# Patient Record
Sex: Male | Born: 1995 | Race: Black or African American | Hispanic: No | Marital: Single | Smoking: Never smoker
Health system: Southern US, Community
[De-identification: ages and names within clinical notes are randomized; demographics above are authoritative.]

---
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
## 2016-09-08 DIAGNOSIS — D509 Iron deficiency anemia, unspecified: Secondary | ICD-10-CM | POA: Diagnosis not present

## 2016-09-08 DIAGNOSIS — Z0001 Encounter for general adult medical examination with abnormal findings: Secondary | ICD-10-CM | POA: Diagnosis not present

## 2016-09-08 DIAGNOSIS — E538 Deficiency of other specified B group vitamins: Secondary | ICD-10-CM | POA: Diagnosis not present

## 2016-09-08 DIAGNOSIS — M2550 Pain in unspecified joint: Secondary | ICD-10-CM | POA: Diagnosis not present

## 2016-09-08 DIAGNOSIS — M25512 Pain in left shoulder: Secondary | ICD-10-CM | POA: Diagnosis not present

## 2016-09-08 DIAGNOSIS — R5383 Other fatigue: Secondary | ICD-10-CM | POA: Diagnosis not present

## 2016-09-22 DIAGNOSIS — E7412 Hereditary fructose intolerance: Secondary | ICD-10-CM | POA: Diagnosis not present

## 2016-09-22 DIAGNOSIS — Z0283 Encounter for blood-alcohol and blood-drug test: Secondary | ICD-10-CM | POA: Diagnosis not present

## 2016-12-07 DIAGNOSIS — R69 Illness, unspecified: Secondary | ICD-10-CM | POA: Diagnosis not present

## 2016-12-08 DIAGNOSIS — R69 Illness, unspecified: Secondary | ICD-10-CM | POA: Diagnosis not present

## 2017-02-01 DIAGNOSIS — R5382 Chronic fatigue, unspecified: Secondary | ICD-10-CM | POA: Diagnosis not present

## 2017-06-16 ENCOUNTER — Encounter (HOSPITAL_COMMUNITY): Payer: Self-pay | Admitting: *Deleted

## 2017-06-16 ENCOUNTER — Other Ambulatory Visit: Payer: Self-pay

## 2017-06-16 ENCOUNTER — Inpatient Hospital Stay (HOSPITAL_COMMUNITY)
Admission: RE | Admit: 2017-06-16 | Discharge: 2017-06-19 | DRG: 885 | Disposition: A | Discharge status: Home / Self Care | Payer: BLUE CROSS/BLUE SHIELD | Attending: Psychiatry | Admitting: Psychiatry

## 2017-06-16 DIAGNOSIS — Z818 Family history of other mental and behavioral disorders: Secondary | ICD-10-CM

## 2017-06-16 DIAGNOSIS — F1290 Cannabis use, unspecified, uncomplicated: Secondary | ICD-10-CM | POA: Diagnosis present

## 2017-06-16 DIAGNOSIS — F332 Major depressive disorder, recurrent severe without psychotic features: Secondary | ICD-10-CM | POA: Diagnosis not present

## 2017-06-16 DIAGNOSIS — Z560 Unemployment, unspecified: Secondary | ICD-10-CM

## 2017-06-16 DIAGNOSIS — Z630 Problems in relationship with spouse or partner: Secondary | ICD-10-CM | POA: Diagnosis not present

## 2017-06-16 MED ORDER — HYDROXYZINE HCL 25 MG PO TABS
25.0000 mg | ORAL_TABLET | Freq: Three times a day (TID) | ORAL | Status: DC | PRN
  Filled 2017-06-16: qty 1

## 2017-06-16 MED ORDER — MAGNESIUM HYDROXIDE 400 MG/5ML PO SUSP: 30.0000 mL | Freq: Every day | ORAL | Status: DC | PRN

## 2017-06-16 MED ORDER — DULOXETINE HCL 30 MG PO CPEP
30.0000 mg | ORAL_CAPSULE | Freq: Every day | ORAL | Status: DC
  Administered 2017-06-16: 30 mg via ORAL
  Filled 2017-06-16 (×3): qty 1

## 2017-06-16 MED ORDER — ALUM & MAG HYDROXIDE-SIMETH 200-200-20 MG/5ML PO SUSP: 30.0000 mL | ORAL | Status: DC | PRN

## 2017-06-16 MED ORDER — TRAZODONE HCL 50 MG PO TABS
50.0000 mg | ORAL_TABLET | Freq: Every evening | ORAL | Status: DC | PRN
  Administered 2017-06-16: 50 mg via ORAL
  Filled 2017-06-16: qty 1

## 2017-06-16 MED ORDER — ACETAMINOPHEN 325 MG PO TABS: 650.0000 mg | ORAL_TABLET | Freq: Four times a day (QID) | ORAL | Status: DC | PRN

## 2017-06-16 NOTE — Tx Team (Signed)
Initial Treatment Plan 06/16/2017 9:14 PM Jermaine DoomAndrew Daigle ZOX:[id number]    PATIENT STRESSORS: Financial difficulties Other: Social anxiety   PATIENT STRENGTHS: Ability for insight Active sense of humor Average or above average intelligence Capable of independent living General fund of knowledge Motivation for treatment/growth Supportive family/friends   PATIENT IDENTIFIED PROBLEMS: Depression Anxiety Suicidal thoughts "Figuring out how to balance it" "Biological father had depression"                     DISCHARGE CRITERIA:  Ability to meet basic life and health needs Improved stabilization in mood, thinking, and/or behavior Reduction of life-threatening or endangering symptoms to within safe limits Verbal commitment to aftercare and medication compliance  PRELIMINARY DISCHARGE PLAN: Attend aftercare/continuing care group  PATIENT/FAMILY INVOLVEMENT: This treatment plan has been presented to and reviewed with the patient, Jermaine Mayer, and/or family member, .  The patient and family have been given the opportunity to ask questions and make suggestions.  Bryelle Spiewak, [place], [place]RN 06/16/2017, 9:14 PM

## 2017-06-16 NOTE — H&P (Signed)
Behavioral Health Medical Screening Exam  Jermaine Mayer is an 21 y.o. male.  Total Time spent with patient: 20 minutes  Psychiatric Specialty Exam: Physical Exam  Constitutional: He is oriented to person, place, and time. He appears well-developed and well-nourished. No distress.  HENT:  Head: Normocephalic and atraumatic.  Right Ear: External ear normal.  Left Ear: External ear normal.  Eyes: Conjunctivae are normal. Right eye exhibits no discharge. Left eye exhibits no discharge. No scleral icterus.  Cardiovascular: Normal rate, regular rhythm and normal heart sounds.  Respiratory: Effort normal and breath sounds normal. No respiratory distress.  Musculoskeletal: Normal range of motion.  Neurological: He is alert and oriented to person, place, and time.  Skin: Skin is warm and dry. He is not diaphoretic.  Psychiatric: His speech is normal. His mood appears anxious. He is withdrawn. He is not actively hallucinating. Thought content is not paranoid and not delusional. He expresses impulsivity and inappropriate judgment. He exhibits a depressed mood. He expresses suicidal ideation. He expresses no homicidal ideation. He expresses suicidal plans.    Review of Systems  Psychiatric/Behavioral: Positive for depression and suicidal ideas. Negative for hallucinations, memory loss and substance abuse. The patient is nervous/anxious and has insomnia.   All other systems reviewed and are negative.   Blood pressure 131/85, pulse 84, temperature 98.9 F (37.2 C), resp. rate 16, SpO2 99 %.There is no height or weight on file to calculate BMI.  General Appearance: Casual and Fairly Groomed  Eye Contact:  Fair  Speech:  Clear and Coherent and Normal Rate  Volume:  Normal  Mood:  Anxious, Depressed, Dysphoric, Hopeless and Worthless  Affect:  Congruent and Depressed  Thought Process:  Coherent and Descriptions of Associations: Intact  Orientation:  Full (Time, Place, and Person)  Thought Content:   Logical and Hallucinations: None  Suicidal Thoughts:  Yes.  without intent/plan  Homicidal Thoughts:  No  Memory:  Immediate;   Fair Recent;   Fair  Judgement:  Fair  Insight:  Fair  Psychomotor Activity:  Decreased  Concentration: Concentration: Fair and Attention Span: Fair  Recall:  [employer]Fair  Fund of Knowledge:Good  Language: Good  Akathisia:  No  Handed:  Right  AIMS (if indicated):     Assets:  Communication Skills Desire for Improvement Housing Intimacy Leisure Time Physical Health  Sleep:       Musculoskeletal: Strength & Muscle Tone: within normal limits Gait & Station: normal  Blood pressure 131/85, pulse 84, temperature 98.9 F (37.2 C), resp. rate 16, SpO2 99 %.  Recommendations:  Based on my evaluation the patient does not appear to have an emergency medical condition.  Jackelyn PolingJason A Zaxton Angerer, NP 06/16/2017, 8:59 PM

## 2017-06-16 NOTE — BH Assessment (Signed)
Assessment Note  Jermaine Mayer is an 21 y.o. male [facility] by EMS with mother  [facility] Pt reports he has hx of depression. Pt reports he was in the shower with clothes on crying from 10:00 AM to later in afternoon when mother came in the door and found him. Pt's mother reports she has not been able to get him to the phone or door for over a week and came to day to check on him. Pt's mother reports she had "banged on the door for hours and finally called 911 and broke in the door and found him in the shower with his clothes on. I thought he the worst. I thought he was dead". Pt denies homicidal thoughts or physical aggression. Pt denies having access to firearms. Pt denies having any legal problems at this time. Pt denies hallucinations. Pt does not appear to be responding to internal stimuli and exhibits no delusional thought. Pt's reality testing appears to be intact. Pt reports he smoked cannabis about 4 weeks ago, drank alcohol a few weeks ago, had amphetamines over 18 months ago.   Pt could not reports any stressors other than, "I feel like everyone is moving but me". Pt completed 2 years of college but did not finish. Pt reports he does not have a job. Pt reports he has battled depression since he was an adolescent. He feels the depression has become worse over the past few months. Pt reports hx of cutting when he was an adolescent. Pt reports his mother and family all support him.   Pt reports he [facility] a few months ago in an outpatient setting. Pt denies hx of inpatient hospitalization.   Pt is unremarkably dressed, alert, oriented x4 with normal speech and normal motor behavior. Eye contact is good and Pt is tearful. Pt's mood is depressed and affect is hopeless. Thought process is coherent and relevant. Pt's insight is poor and judgement is poor. There is no indication Pt is currently responding to internal stimuli or experiencing delusional thought content. Pt was cooperative  throughout assessment. He says he is willing to sign voluntarily into a psychiatric facility.    Diagnosis: F32.2 Major depressive disorder, Single episode, Severe   Past Medical History: No past medical history on file.  Family History: No family history on file.  Social History:  has no tobacco, alcohol, and drug history on file.  Additional Social History:  Alcohol / Drug Use Pain Medications: See MAR Prescriptions: See MAR Over the Counter: See MAR History of alcohol / drug use?: Yes Substance #1 Name of Substance 1: Cannabis 1 - Age of First Use: 16 1 - Amount (size/oz): Blunt 1 - Frequency: Every now and then 1 - Last Use / Amount: 6 weeks ago Substance #2 Name of Substance 2: ETOH 2 - Age of First Use: 20 2 - Amount (size/oz): varies 2 - Frequency: every now and then 2 - Last Use / Amount: 3 months ago Substance #3 Name of Substance 3: Amphetamines  3 - Age of First Use: 17 3 - Amount (size/oz): Ukn 3 - Frequency: every now and then 3 - Last Use / Amount: 18 months ago  CIWA:   COWS:    Allergies: Allergies not on file  Home Medications:  No medications prior to admission.    OB/GYN Status:  No LMP for male patient.  General Assessment Data [facility] TTS Assessment: In system Is this a Tele or  Face-to-Face Assessment?: Face-to-Face Is this an Initial Assessment or a Re-assessment for this encounter?: Initial Assessment Marital status: Single Is patient pregnant?: No Pregnancy Status: No Living Arrangements: Alone Can pt return to current living arrangement?: Yes Admission Status: Voluntary Is patient capable of signing voluntary admission?: Yes Referral Source: Self/Family/Friend Insurance type: BCBS  Medical Screening Exam [facility] Walk-in ONLY) Medical Exam completed: Yes  Crisis Care Plan Living Arrangements: Alone Name of Psychiatrist: [facility] Name of Therapist: [facility]  Education Status Is  patient currently in school?: No Highest grade of school patient has completed: Some college  Risk to self with the past 6 months Suicidal Ideation: Yes-Currently Present Has patient been a risk to self within the past 6 months prior to admission? : No Suicidal Intent: Yes-Currently Present Has patient had any suicidal intent within the past 6 months prior to admission? : No Is patient at risk for suicide?: Yes Suicidal Plan?: Yes-Currently Present Has patient had any suicidal plan within the past 6 months prior to admission? : No Specify Current Suicidal Plan: Wreck car Access to Means: Yes Specify Access to Suicidal Means: Pt has a car What has been your use of drugs/alcohol within the last 12 months?: Cannabis Previous Attempts/Gestures: No Other Self Harm Risks: None Triggers for Past Attempts: None known Intentional Self Injurious Behavior: Cutting(Cutting when he was a teen) Family Suicide History: No Recent stressful life event(s): Job Loss, Other (Comment)(Pt feels he isn't moving forward like everyone else) Persecutory voices/beliefs?: No Depression: Yes Depression Symptoms: Insomnia, Tearfulness, Isolating, Fatigue, Loss of interest in usual pleasures, Feeling worthless/self pity, Despondent Substance abuse history and/or treatment for substance abuse?: Yes(Cannabis, Amphetamines, Alcohol) Suicide prevention information given to non-admitted patients: Not applicable  Risk to Others within the past 6 months Homicidal Ideation: No Does patient have any lifetime risk of violence toward others beyond the six months prior to admission? : No Thoughts of Harm to Others: No Current Homicidal Intent: No Current Homicidal Plan: No Access to Homicidal Means: No History of harm to others?: No Assessment of Violence: None Noted Does patient have access to weapons?: No Criminal Charges Pending?: No Does patient have a court date: No Is patient on probation?:  No  Psychosis Hallucinations: None noted Delusions: None noted  Mental Status Report Appearance/Hygiene: Unremarkable Eye Contact: Fair Motor Activity: Unremarkable Speech: Soft, Logical/coherent Level of Consciousness: Alert, Crying Mood: Depressed, Helpless, Sad, Worthless, low self-esteem Affect: Depressed, Sad Anxiety Level: Minimal Thought Processes: Coherent Judgement: Impaired Orientation: Person, Place, Time, Situation, Appropriate for developmental age Obsessive Compulsive Thoughts/Behaviors: None  Cognitive Functioning Concentration: Decreased Memory: Recent Intact IQ: Average Insight: Poor Impulse Control: Poor Appetite: Fair Weight Loss: 0 Weight Gain: 0 Sleep: Decreased Total Hours of Sleep: 4 Vegetative Symptoms: None  ADLScreening [facility] Assessment Services) Patient's cognitive ability adequate to safely complete daily activities?: Yes Patient able to express need for assistance with ADLs?: No Independently performs ADLs?: Yes (appropriate for developmental age)  Prior Inpatient Therapy Prior Inpatient Therapy: No  Prior Outpatient Therapy Prior Outpatient Therapy: Yes Prior Therapy Dates: 2018 Prior [facility] Facilty[facility]Provider[facility]): [facility] Reason for Treatment: Depression Does patient have an ACCT team?: No Does patient have Intensive In-House Services?  : No Does patient have [facility] services? : No Does patient have P4CC services?: No  ADL Screening (condition at time of admission) Patient's cognitive ability adequate to safely complete daily activities?: Yes Is the patient deaf or have difficulty hearing?: No Does the patient have difficulty seeing, even when wearing glasses/contacts?: No Does  the patient have difficulty concentrating, remembering, or making decisions?: No Patient able to express need for assistance with ADLs?: No Does the patient have difficulty dressing or bathing?: No Independently performs ADLs?: Yes (appropriate for  developmental age) Does the patient have difficulty walking or climbing stairs?: No Weakness of Legs: None Weakness of Arms/Hands: None       Abuse/Neglect Assessment (Assessment to be complete while patient is alone) Abuse/Neglect Assessment Can Be Completed: Yes Physical Abuse: Denies Verbal Abuse: Denies Sexual Abuse: Denies Exploitation of patient/patient's resources: Denies Self-Neglect: Denies Values / Beliefs Cultural Requests During Hospitalization: None Spiritual Requests During Hospitalization: None Consults Spiritual Care Consult Needed: No Social Work Consult Needed: No Advance Directives (For Healthcare) Does Patient Have a Medica[employer]chant navy officerl Advance Directive?: No Would patient like information on creating a medical advance directive?: No - Patient declined    Additional Information 1:1 In Past 12 Months?: No CIRT Risk: No Elopement Risk: No Does patient have medical clearance?: Yes     Disposition:  Disposition Initial Assessment Completed for this Encounter: Yes Disposition of Patient: Inpatient treatment program Type of inpatient treatment program: Adult   Per Nira ConnJason Berry, NP pt meets inpatient criteria. [facility]   On Site Evaluation by:   Reviewed with Physician:    Danae OrleansVanessa  Norvell Ureste, MA, LPCA 06/16/2017 8:30 PM

## 2017-06-16 NOTE — Progress Notes (Signed)
Jermaine Mayer is a 21 year old male pt admitted voluntarily as a walk-in after presenting with his mother. On admission, Jermaine Mayer appears sad and tearful and does endorse depression and passive thoughts of suicide but is able to contract for safety while in the hospital. He reports having social anxiety and spoke about how he was over-weight as a child and was teased and bullied because of it. He reports that he is prescribed cymbalta which he says he takes as prescribed. He denies any substance abuse issues and says he rarely drinks or uses any marijuana. He reports that he has been battling depression for a long time and reports this is his first hospitalization. He reports that he lives alone but is unsure where he will go once he is discharged. He was oriented to the unit and safety maintained.

## 2017-06-17 DIAGNOSIS — F1290 Cannabis use, unspecified, uncomplicated: Secondary | ICD-10-CM

## 2017-06-17 DIAGNOSIS — Z560 Unemployment, unspecified: Secondary | ICD-10-CM

## 2017-06-17 DIAGNOSIS — Z818 Family history of other mental and behavioral disorders: Secondary | ICD-10-CM

## 2017-06-17 DIAGNOSIS — Z630 Problems in relationship with spouse or partner: Secondary | ICD-10-CM

## 2017-06-17 DIAGNOSIS — F332 Major depressive disorder, recurrent severe without psychotic features: Principal | ICD-10-CM

## 2017-06-17 LAB — RAPID URINE DRUG SCREEN, HOSP PERFORMED
AMPHETAMINES: NOT DETECTED
BARBITURATES: NOT DETECTED
BENZODIAZEPINES: NOT DETECTED
Cocaine: NOT DETECTED
Opiates: NOT DETECTED
TETRAHYDROCANNABINOL: POSITIVE — AB

## 2017-06-17 LAB — COMPREHENSIVE METABOLIC PANEL
ALT: 20 U/L (ref 17–63)
ANION GAP: 9 (ref 5–15)
AST: 27 U/L (ref 15–41)
Albumin: 4.4 g/dL (ref 3.5–5.0)
Alkaline Phosphatase: 61 U/L (ref 38–126)
BUN: 11 mg/dL (ref 6–20)
CHLORIDE: 105 mmol/L (ref 101–111)
CO2: 25 mmol/L (ref 22–32)
Calcium: 9.7 mg/dL (ref 8.9–10.3)
Creatinine, Ser: 0.92 mg/dL (ref 0.61–1.24)
Glucose, Bld: 119 mg/dL — ABNORMAL HIGH (ref 65–99)
POTASSIUM: 3.5 mmol/L (ref 3.5–5.1)
SODIUM: 139 mmol/L (ref 135–145)
Total Bilirubin: 0.8 mg/dL (ref 0.3–1.2)
Total Protein: 8.1 g/dL (ref 6.5–8.1)

## 2017-06-17 LAB — LIPID PANEL
CHOL/HDL RATIO: 4.8 ratio
CHOLESTEROL: 177 mg/dL (ref 0–200)
HDL: 37 mg/dL — AB (ref >40)
LDL Cholesterol: 111 mg/dL — ABNORMAL HIGH (ref 0–99)
TRIGLYCERIDES: 144 mg/dL (ref <150)
VLDL: 29 mg/dL (ref 0–40)

## 2017-06-17 LAB — CBC
HCT: 46.4 % (ref 39.0–52.0)
Hemoglobin: 16.2 g/dL (ref 13.0–17.0)
MCH: 29.1 pg (ref 26.0–34.0)
MCHC: 34.9 g/dL (ref 30.0–36.0)
MCV: 83.3 fL (ref 78.0–100.0)
PLATELETS: 267 10*3/uL (ref 150–400)
RBC: 5.57 MIL/uL (ref 4.22–5.81)
RDW: 12.6 % (ref 11.5–15.5)
WBC: 10.8 10*3/uL — ABNORMAL HIGH (ref 4.0–10.5)

## 2017-06-17 LAB — HEMOGLOBIN A1C
HEMOGLOBIN A1C: 4.8 % (ref 4.8–5.6)
MEAN PLASMA GLUCOSE: 91.06 mg/dL

## 2017-06-17 LAB — TSH: TSH: 1.367 u[IU]/mL (ref 0.350–4.500)

## 2017-06-17 MED ORDER — DULOXETINE HCL 60 MG PO CPEP
60.0000 mg | ORAL_CAPSULE | Freq: Every day | ORAL | Status: DC
  Administered 2017-06-17 – 2017-06-19 (×3): 60 mg via ORAL
  Filled 2017-06-17 (×5): qty 1

## 2017-06-17 NOTE — Progress Notes (Signed)
Nursing Note: 0700-1900  D:  Pt presents with depressed mood and flat affect.  States that he has not taken his Cymbalta as prescribed the past 2 months.  "I have felt very lonely with no one around me." Rates that depression feels 5/10, hopelessness 4/10 and anxiety 6/10.  States that his goal is to improve sense of self value and improve mood.  A:  Encouraged to verbalize needs and concerns, active listening and support provided.  Continued Q 15 minute safety checks.  Observed active participation in group settings.  R:  Pt. has brightened throughout shift, observed interacting with peers in dayroom. Denies A/V hallucinations and is able to verbally contract for safety.

## 2017-06-17 NOTE — Progress Notes (Signed)
Adult Psychoeducational Group Note  Date:  06/17/2017 Time:  9:59 PM  Group Topic/Focus:  Wrap-Up Group:   The focus of this group is to help patients review their daily goal of treatment and discuss progress on daily workbooks.  Participation Level:  Active  Participation Quality:  Appropriate  Affect:  Appropriate  Cognitive:  Appropriate  Insight: Appropriate  Engagement in Group:  Engaged  Modes of Intervention:  Discussion  Additional Comments:  Pt stated his goal for today was to interact more with his peers. Pt stated he accomplished his goal today. Pt rated his day a 7 out of 10. Pt stated his family coming by to visited help improve his day.  Jermaine FurnaceChristopher  [name] 06/17/2017, 9:59 PM

## 2017-06-17 NOTE — Plan of Care (Signed)
  Safety: Periods of time without injury will increase 06/17/2017 2144 - Progressing by Delos HaringPhillips, Antoinne Spadaccini A, RN Note Pt safe on the unit at this time

## 2017-06-17 NOTE — Progress Notes (Addendum)
Recreation Therapy Notes  Date: 06/17/17 Time: 0930 Location: 300 Hall Dayroom  Group Topic: Stress Management  Goal Area(s) Addresses:  Patient will verbalize importance of using healthy stress management.  Patient will identify positive emotions associated with healthy stress management.   Intervention: Stress Management  Activity :  Progressive Muscle Relaxation.  LRT read a script to lead patients through the stress management technique of progressive muscle relaxation.  Patients were to follow along to engage in the activity as LRT read the script.  Education:  Stress Management, Discharge Planning.   Education Outcome: Acknowledges edcuation/In group clarification offered/Needs additional education  Clinical Observations/Feedback: Pt did not attend group.    Kaitland Lewellyn, LRT/CTRS          Herold Salguero A 06/17/2017 11:10 AM 

## 2017-06-17 NOTE — [facility] Suicide Risk Assessment (Signed)
[facility]   Nursing information obtained from:    Demographic factors:    Current Mental Status:    Loss Factors:    Historical Factors:    Risk Reduction Factors:     Total Time spent with patient: 30 minutes Principal Problem: <principal problem not specified> Diagnosis:   Patient Active Problem List   Diagnosis Date Noted  . Severe recurrent major depression without psychotic features (HCC) [F33.2] 06/16/2017   Subjective Data:  21 y.o AAM, single, recently lost his job and relationship, lives alone. Background history of MDD recurrent. Presented to the unit in company of his family. His family had not been able to reach him in over two weeks. He has not been answering his phone. Mother visited but he would not answer the door. 911 was called and they found him naked in the shower crying. Reported to have been very depressed. Lost his job two months ago. Lost his relationship about the same time. Routine labs are within normal limits.  Toxicology is negative,  UDS for THC , BAL<10 mg/dl. No past suicidal behavior, no family history of suicide, no evidence of psychosis. No evidence of mania. No cognitive impairment. No access to weapons. He is cooperative with care. He has agreed to treatment recommendations. He has agreed to communicate suicidal thoughts of with staff if the thoughts becomes overwhelming.    Continued Clinical Symptoms:  Alcohol Use Disorder Identification Test Final Score (AUDIT): 1 The "Alcohol Use Disorders Identification Test", Guidelines for Use in Primary Care, Second Edition.  World Science writerHealth Organization [facility](WHO). Score between 0-7:  no or low risk or alcohol related problems. Score between 8-15:  moderate risk of alcohol related problems. Score between 16-19:  high risk of alcohol related problems. Score 20 or above:  warrants further diagnostic evaluation for alcohol dependence and treatment.   CLINICAL FACTORS:    MDD   Musculoskeletal: Strength & Muscle Tone: within normal limits Gait & Station: normal Patient leans: N/A  Psychiatric Specialty Exam: Physical Exam  ROS  Blood pressure 114/66, pulse 90, temperature 98.6 F (37 C), temperature source Oral, resp. rate 20, height 5\' 11"  (1.803 m), weight 98.9 kg (218 lb), SpO2 99 %.Body mass index is 30.4 kg/m.  General Appearance: As in H&P  Eye Contact:    Speech:    Volume:    Mood:    Affect:    Thought Process:    Orientation:  As in H&P  Thought Content:    Suicidal Thoughts:    Homicidal Thoughts:    Memory:    Judgement:    Insight:    Psychomotor Activity:    Concentration:    Recall:    Fund of Knowledge:    Language:    Akathisia:    Handed:    AIMS (if indicated):     Assets:  As in H&P  ADL's:    Cognition:    Sleep:  Number of Hours: 5.5      COGNITIVE FEATURES THAT CONTRIBUTE TO RISK:  None    SUICIDE RISK:   Minimal: No identifiable suicidal ideation.  Patients presenting with no risk factors but with morbid ruminations; may be classified as minimal risk based on the severity of the depressive symptoms  PLAN OF CARE:  As in H&P  I certify that inpatient services furnished can reasonably be expected to improve the patient's condition.   Georgiann CockerVincent A Izediuno, MD 06/17/2017, 12:05 PM

## 2017-06-17 NOTE — H&P (Signed)
Psychiatric Admission Assessment Adult  Patient Identification: Jermaine Mayer MRN:  [id number] Date of Evaluation:  06/17/2017 Chief Complaint:  Worsening depression Principal Diagnosis: MDD Recurrent Diagnosis:   Patient Active Problem List   Diagnosis Date Noted  . Severe recurrent major depression without psychotic features ([place]) [F33.2] 06/16/2017   History of Present Illness:   21 y.o AAM, single, recently lost his job and relationship, lives alone. Background history of MDD recurrent. Presented to the unit in company of his family. His family had not been able to reach him in over two weeks. He has not been answering his phone. Mother visited but he would not answer the door. 911 was called and they found him naked in the shower crying. Reported to have been very depressed. Lost his job two months ago. Lost his relationship about the same time. Routine labs are within normal limits.  Toxicology is negative,  UDS for THC , BAL<10 mg/dl.  At interview, patient reports a family and personal history of depression. Says he had been doing well until he lost his job two months ago. He had been at the job for nine months. 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 Says he was having problems with his girlfriend a couple of months ago. It affected him and he missed work twice. As per the policy at his work place he was fired. Patient says he has become withdrawn since then. He has been isolating himself in the house. He has been crying a lot. Says his sleep pattern had become erratic. He lost interest in everything. He was struggling to take care of his dog. Says his thought process slowed down. He was not able to enjoy his routine activities. Says he had not gone into his Facebook or Instagram in a long time. Patient detached self from friends. Says he signed into Reddit which hides his identity. Patient decreased appetite. He was drinking protein shakes. His weight fluctuated recently. Say he has lost  a lot of self esteem. Patient denies any suicidal thoughts. Says he has been hopeful he would find another job. His therapist started him on Duloxetine 30 mg daily. Says it has been marginally helpful.. No associated evidence of psychosis. No associated evidence of mania. No thoughts of violence. He minimizes use of THC. No alcohol use. Denies use of any other substance. No synthetic substance use. No access to weapons. No legal issues other than speeding tickets. No other stressors.   Total Time spent with patient: 1 hour  Past Psychiatric History: History of depression as an adolescent.  No past history of mania. No past history of psychosis. No past history of suicidal behavior. No past history of violent behavior. She has never had any inpatient treatment in the past. Trial of Duloxetine recently. No other medication trial.   Is the patient at risk to self? No.  Has the patient been a risk to self in the past 6 months? No.  Has the patient been a risk to self within the distant past? No.  Is the patient a risk to others? No.  Has the patient been a risk to others in the past 6 months? No.  Has the patient been a risk to others within the distant past? No.   Prior Inpatient Therapy: Prior Inpatient Therapy: No Prior Outpatient Therapy: Prior Outpatient Therapy: Yes Prior Therapy Dates: 2018 Prior Therapy Facilty/Provider(s): [facility], [place] Reason for Treatment: Depression Does patient have an ACCT team?: No Does patient have Intensive In-House Services?  :  No Does patient have [facility] services? : No Does patient have P4CC services?: No  Alcohol Screening: 1. How often do you have a drink containing alcohol?: Monthly or less 2. How many drinks containing alcohol do you have on a typical day when you are drinking?: 1 or 2 3. How often do you have six or more drinks on one occasion?: Never AUDIT-C Score: 1 4. How often during the last year have you found that you were not able to stop  drinking once you had started?: Never 5. How often during the last year have you failed to do what was normally expected from you becasue of drinking?: Never 6. How often during the last year have you needed a first drink in the morning to get yourself going after a heavy drinking session?: Never 7. How often during the last year have you had a feeling of guilt of remorse after drinking?: Never 8. How often during the last year have you been unable to remember what happened the night before because you had been drinking?: Never 9. Have you or someone else been injured as a result of your drinking?: No 10. Has a relative or friend or a doctor or another health worker been concerned about your drinking or suggested you cut down?: No Alcohol Use Disorder Identification Test Final Score (AUDIT): 1 Intervention/Follow-up: AUDIT Score <7 follow-up not indicated Substance Abuse History in the last 12 months:  Yes.   Consequences of Substance Abuse: As above  Previous Psychotropic Medications: Yes  Psychological Evaluations: Yes  Past Medical History: History reviewed. No pertinent past medical history. History reviewed. No pertinent surgical history. Family History: History reviewed. No pertinent family history. Family Psychiatric  History: Father has severe unipolar depression.  Tobacco Screening: Have you used any form of tobacco in the last 30 days? (Cigarettes, Smokeless Tobacco, Cigars, and/or Pipes): No Social History:  Social History   Substance and Sexual Activity  Alcohol Use Yes   Comment: occasionally     Social History   Substance and Sexual Activity  Drug Use No    Additional Social History: Marital status: Single    Pain Medications: See MAR Prescriptions: See MAR Over the Counter: See MAR History of alcohol / drug use?: Yes Name of Substance 1: Cannabis 1 - Age of First Use: 16 1 - Amount (size/oz): Blunt 1 - Frequency: Every now and then 1 - Last Use / Amount: 6  weeks ago Name of Substance 2: ETOH 2 - Age of First Use: 20 2 - Amount (size/oz): varies 2 - Frequency: every now and then 2 - Last Use / Amount: 3 months ago Name of Substance 3: Amphetamines  3 - Age of First Use: 17 3 - Amount (size/oz): Ukn 3 - Frequency: every now and then 3 - Last Use / Amount: 18 months ago              Allergies:  No Known Allergies Lab Results:  Results for orders placed or performed during the hospital encounter of 06/16/17 (from the past 48 hour(s))  Urine rapid drug screen (hosp performed)not at [facility]     Status: Abnormal   Collection Time: 06/16/17  8:51 PM  Result Value Ref Range   Opiates NONE DETECTED NONE DETECTED   Cocaine NONE DETECTED NONE DETECTED   Benzodiazepines NONE DETECTED NONE DETECTED   Amphetamines NONE DETECTED NONE DETECTED   Tetrahydrocannabinol POSITIVE (A) NONE DETECTED   Barbiturates NONE DETECTED NONE DETECTED    Comment: (NOTE)  DRUG SCREEN FOR MEDICAL PURPOSES ONLY.  IF CONFIRMATION IS NEEDED FOR ANY PURPOSE, NOTIFY LAB WITHIN 5 DAYS. LOWEST DETECTABLE LIMITS FOR URINE DRUG SCREEN Drug Class                     Cutoff (ng/mL) Amphetamine and metabolites    1000 Barbiturate and metabolites    200 Benzodiazepine                 748 Tricyclics and metabolites     300 Opiates and metabolites        300 Cocaine and metabolites        300 THC                            50 Performed at [facility], [place] [address] [place], [place] [address]   CBC     Status: Abnormal   Collection Time: 06/17/17  7:50 AM  Result Value Ref Range   WBC 10.8 (H) 4.0 - 10.5 K/uL   RBC 5.57 4.22 - 5.81 MIL/uL   Hemoglobin 16.2 13.0 - 17.0 g/dL   HCT 46.4 39.0 - 52.0 %   MCV 83.3 78.0 - 100.0 fL   MCH 29.1 26.0 - 34.0 pg   MCHC 34.9 30.0 - 36.0 g/dL   RDW 12.6 11.5 - 15.5 %   Platelets 267 150 - 400 K/uL    Comment: Performed at [facility], [place] [address], [place], [place] [address]   Comprehensive metabolic panel     Status: Abnormal   Collection Time: 06/17/17  7:50 AM  Result Value Ref Range   Sodium 139 135 - 145 mmol/L   Potassium 3.5 3.5 - 5.1 mmol/L   Chloride 105 101 - 111 mmol/L   CO2 25 22 - 32 mmol/L   Glucose, Bld 119 (H) 65 - 99 mg/dL   BUN 11 6 - 20 mg/dL   Creatinine, Ser 0.92 0.61 - 1.24 mg/dL   Calcium 9.7 8.9 - 10.3 mg/dL   Total Protein 8.1 6.5 - 8.1 g/dL   Albumin 4.4 3.5 - 5.0 g/dL   AST 27 15 - 41 U/L   ALT 20 17 - 63 U/L   Alkaline Phosphatase 61 38 - 126 U/L   Total Bilirubin 0.8 0.3 - 1.2 mg/dL   GFR calc non Af Amer >60 >60 mL/min   GFR calc Af Amer >60 >60 mL/min    Comment: (NOTE) The eGFR has been calculated using the CKD EPI equation. This calculation has not been validated in all clinical situations. eGFR's persistently <60 mL/min signify possible Chronic Kidney Disease.    Anion gap 9 5 - 15    Comment: Performed at [facility], [place] [address] [place], [place] [address]  Hemoglobin A1c     Status: None   Collection Time: 06/17/17  7:50 AM  Result Value Ref Range   Hgb A1c MFr Bld 4.8 4.8 - 5.6 %    Comment: (NOTE) Pre diabetes:          5.7%-6.4% Diabetes:              >6.4% Glycemic control for   <7.0% adults with diabetes    Mean Plasma Glucose 91.06 mg/dL    Comment: Performed at [facility] [address] [place], [place] [address]  Lipid panel     Status: Abnormal   Collection Time: 06/17/17  7:50 AM  Result Value Ref Range   Cholesterol 177 0 - 200 mg/dL   Triglycerides 144 <150 mg/dL   HDL 37 (L) >40 mg/dL   Total CHOL/HDL Ratio 4.8 RATIO   VLDL 29 0 - 40 mg/dL   LDL Cholesterol 111 (H) 0 - 99 mg/dL    Comment:        Total Cholesterol/HDL:CHD Risk Coronary Heart Disease Risk Table                     Men   Women  1/2 Average Risk   3.4   3.3  Average Risk       5.0   4.4  2 X Average Risk   9.6   7.1  3 X Average Risk  23.4   11.0        Use the calculated Patient  Ratio above and the CHD Risk Table to determine the patient's CHD Risk.        ATP III CLASSIFICATION (LDL):  <100     mg/dL   Optimal  100-129  mg/dL   Near or Above                    Optimal  130-159  mg/dL   Borderline  160-189  mg/dL   High  >190     mg/dL   Very High Performed at [facility] [address], [place], [place] [address]   TSH     Status: None   Collection Time: 06/17/17  7:50 AM  Result Value Ref Range   TSH 1.367 0.350 - 4.500 uIU/mL    Comment: Performed by a 3rd Generation assay with a functional sensitivity of <=0.01 uIU/mL. Performed at [facility], [place] [address] [place], [place] [address]     Blood Alcohol level:  No results found for: [facility]  Metabolic Disorder Labs:  Lab Results  Component Value Date   HGBA1C 4.8 06/17/2017   MPG 91.06 06/17/2017   No results found for: PROLACTIN Lab Results  Component Value Date   CHOL 177 06/17/2017   TRIG 144 06/17/2017   HDL 37 (L) 06/17/2017   CHOLHDL 4.8 06/17/2017   VLDL 29 06/17/2017   LDLCALC 111 (H) 06/17/2017    Current Medications: Current Facility-Administered Medications  Medication Dose Route Frequency Provider Last Rate Last Dose  . acetaminophen (TYLENOL) tablet 650 mg  650 mg Oral Q6H PRN Lindon Romp A, NP      . alum & mag hydroxide-simeth (MAALOX/MYLANTA) 200-200-20 MG/5ML suspension 30 mL  30 mL Oral Q4H PRN Rozetta Nunnery, NP      . DULoxetine (CYMBALTA) DR capsule 30 mg  30 mg Oral QHS Lindon Romp A, NP   30 mg at 06/16/17 2258  . hydrOXYzine (ATARAX/VISTARIL) tablet 25 mg  25 mg Oral TID PRN Lindon Romp A, NP      . magnesium hydroxide (MILK OF MAGNESIA) suspension 30 mL  30 mL Oral Daily PRN Lindon Romp A, NP      . traZODone (DESYREL) tablet 50 mg  50 mg Oral QHS PRN Lindon Romp A, NP   50 mg at 06/16/17 2259   PTA Medications: No medications prior to admission.    Musculoskeletal: Strength & Muscle Tone: within normal  limits Gait & Station: normal Patient leans: N/A  Psychiatric Specialty Exam: Physical Exam  Constitutional: He appears well-developed and well-nourished.  HENT:  Head: Normocephalic and atraumatic.  Respiratory:  Effort normal.  Neurological: He is alert.  Psychiatric:  As above     ROS  Blood pressure 114/66, pulse 90, temperature 98.6 F (37 C), temperature source Oral, resp. rate 20, height '5\' 11"'$  (1.803 m), weight 98.9 kg (218 lb), SpO2 99 %.Body mass index is 30.4 kg/m.  General Appearance: Neatly dressed, was reading just prior to interview. Not in any distress. Good relatedness.   Eye Contact:  Good  Speech:  Clear and Coherent and Normal Rate  Volume:  Normal  Mood:  Depressed  Affect:  Appropriate and Restricted  Thought Process:  Linear  Orientation:  Full (Time, Place, and Person)  Thought Content:  Negative ruminations, negative view of self. No delusional theme. No preoccupation with violent thoughts. No obsession.  No hallucination in any modality.   Suicidal Thoughts:  No  Homicidal Thoughts:  No  Memory:  Immediate;   Good Recent;   Good Remote;   Good  Judgement:  Fair  Insight:  Good  Psychomotor Activity:  Normal  Concentration:  Concentration: Fair and Attention Span: Fair  Recall:  Good  Fund of Knowledge:  Good  Language:  Good  Akathisia:  Negative  Handed:    AIMS (if indicated):     Assets:  Communication Skills Desire for Improvement Housing Physical Health Resilience Social Support  ADL's:  Intact  Cognition:  WNL  Sleep:  Number of Hours: 5.5    Treatment Plan Summary: Patient has a family and personal history of depression. Recent episode was precipitated by loss of his job and relationship. He is pervasively down but not suicidal. He is hopeful and has been taking his medication. We have agreed to optimize his dose. Patient consented to treatment.   Psychiatric: MDD Recurrent THC use Disorder  Medical:  Psychosocial:   Unemployed Recent break up  PLAN: 1. Increase Duloxetine to 60 mg daily 2. Encourage unit groups and activities 3. Monitor mood, behavior and interaction with peers 4. Motivational enhancement  5. SW would gather collateral from his family and facilitate aftercare   Observation Level/Precautions:  15 minute checks  Laboratory:    Psychotherapy:    Medications:    Consultations:    Discharge Concerns:    Estimated LOS:  Other:     Physician Treatment Plan for Primary Diagnosis: <principal problem not specified> Long Term Goal(s): Improvement in symptoms so as ready for discharge  Short Term Goals: Ability to identify changes in lifestyle to reduce recurrence of condition will improve, Ability to verbalize feelings will improve, Ability to disclose and discuss suicidal ideas, Ability to demonstrate self-control will improve, Ability to identify and develop effective coping behaviors will improve, Ability to maintain clinical measurements within normal limits will improve, Compliance with prescribed medications will improve and Ability to identify triggers associated with substance abuse/mental health issues will improve  Physician Treatment Plan for Secondary Diagnosis: Active Problems:   Severe recurrent major depression without psychotic features ([place])  Long Term Goal(s): Improvement in symptoms so as ready for discharge  Short Term Goals: Ability to identify changes in lifestyle to reduce recurrence of condition will improve, Ability to verbalize feelings will improve, Ability to disclose and discuss suicidal ideas, Ability to demonstrate self-control will improve, Ability to identify and develop effective coping behaviors will improve, Ability to maintain clinical measurements within normal limits will improve, Compliance with prescribed medications will improve and Ability to identify triggers associated with substance abuse/mental health issues will improve  I certify that  inpatient services furnished  can reasonably be expected to improve the patient's condition.    [place], MD 12/21/201811:16 AM

## 2017-06-17 NOTE — Progress Notes (Signed)
D: Pt denies SI/HI/AVH. Pt is pleasant and cooperative. Pt visible in the dayroom, pt keeps to himself the patient interacts with peere at times.   A: Pt was offered support and encouragement. Pt was given scheduled medications. Pt was encourage to attend groups. Q 15 minute checks were done for safety.   R:Pt attends groups and interacts well with peers and staff. Pt is taking medication. Pt has no complaints.Pt receptive to treatment and safety maintained on unit.

## 2017-06-17 NOTE — BHH Counselor (Signed)
Adult Comprehensive Assessment  Patient ID: Jermaine Mayer, male   DOB: [birth year], 21 y.o.   MRN: [id number]  Information Source: Information source: Patient  Current Stressors:   lives alone Family history of depression Unemployed; relying on grandfather to pay bills currently Guilt concerning how his depression affects family.   Living/Environment/Situation:  Living Arrangements: Alone Living conditions (as described by patient or guardian): living alone in apt (dog-pit boxer).  How long has patient lived in current situation?: one year  What is atmosphere in current home: Comfortable  Family History:  Marital status: Single Are you sexually active?: Yes What is your sexual orientation?: heterosexual Has your sexual activity been affected by drugs, alcohol, medication, or emotional stress?: n/a  Does patient have children?: No  Childhood History:  By whom was/is the patient raised?: Both parents Additional childhood history information: parents divorced at 33-13. equal amount of time with both parents. "he is not my biological dad but I never met him."  Description of patient's relationship with caregiver when they were a child: close to both parents Patient's description of current relationship with people who raised him/her: close to mother; close to dad as well.  How were you disciplined when you got in trouble as a child/adolescent?: n/a  Does patient have siblings?: Yes Number of Siblings: 2 Description of patient's current relationship with siblings: younger brother and sisters 30 and 69.  Did patient suffer any verbal/emotional/physical/sexual abuse as a child?: No Did patient suffer from severe childhood neglect?: No Has patient ever been sexually abused/assaulted/raped as an adolescent or adult?: No Was the patient ever a victim of a crime or a disaster?: No Witnessed domestic violence?: No Has patient been effected by domestic violence as an adult?: No  Education:   Highest grade of school patient has completed: Some college 2 1/2 years--[employer]. I dropped out/college stress.  Currently a student?: No Learning disability?: No  Employment/Work Situation:   Employment situation: Unemployed Patient's job has been impacted by current illness: Yes Describe how patient's job has been impacted: "I had a 2 strike policy and I messed up one too many times."  What is the longest time patient has a held a job?: 8-9 months Where was the patient employed at that time?: fast food Has patient ever been in the [employer]?: No Has patient ever served in combat?: No Did You Receive Any Psychiatric Treatment/Services While in Passenger transport manager?: No Are There Guns or Other Weapons in [place]?: No Are These Psychologist, educational?: (n/a)  Financial Resources:   Museum/gallery curator resources: Multimedia programmer, Support from parents / caregiver Does patient have a Programmer, applications or guardian?: No  Alcohol/Substance Abuse:   What has been your use of drugs/alcohol within the last 12 months?: intermittent marijuana use every 3 weeks. no alcohol or other drug use. If attempted suicide, did drugs/alcohol play a role in this?: No(thoughts entered my mind but I never acted on it.) Alcohol/Substance Abuse Treatment Hx: Denies past history, Past Tx, Outpatient If yes, describe treatment: [facility] mostly talk therapy; [place].  Has alcohol/substance abuse ever caused legal problems?: No  Social Support System:   Patient's Community Support System: Fair Astronomer System: few close friends in community Type of faith/religion: n/a  How does patient's faith help to cope with current illness?: n/a  Leisure/Recreation:   Leisure and Hobbies: "be outside. take my dog out to the park." photography.  Strengths/Needs:   What things does the patient do well?: intelligent; insightful In  what areas does patient struggle / problems for patient:  depressed; anxious. "when it's really bad, I get paranoid."   Discharge Plan:   Does patient have access to transportation?: Yes(car and license) Will patient be returning to same living situation after discharge?: Yes Currently receiving community mental health services: No If no, would patient like referral for services when discharged?: Yes (What county?)([place]) Does patient have financial barriers related to discharge medications?: No(insurance)  Summary/Recommendations:   Summary and Recommendations (to be completed by the evaluator): Patient is 21yo male living in [place], [place] alone. Patient presents to the hospital seeking treatment for increased depression/anxiety/mood instability, passive SI, and requesting medication stabilization. Patient reports ongoing depression for years that has progressed over the past few months. He is currently unemployed and is looking for employment; single; no children. Patient states that his mother is his primary support. Recommendations for patient include: crisis stabilization, therapeutic milieu, encourage group attendance and participation, medication management for mood stabilization, and development of comprehensive mental wellness plan. CSW assessing.   Kimber Relic Smart LCSW 06/17/2017 4:15 PM

## 2017-06-17 NOTE — Tx Team (Signed)
Interdisciplinary Treatment and Diagnostic Plan Update  06/17/2017 Time of Session: 6301SW Jermaine Mayer MRN: [id number]  Principal Diagnosis: Severe recurrent major depression without psychotic features [facility])  Secondary Diagnoses: Principal Problem:   Severe recurrent major depression without psychotic features ([place])   Current Medications:  Current Facility-Administered Medications  Medication Dose Route Frequency Provider Last Rate Last Dose  . acetaminophen (TYLENOL) tablet 650 mg  650 mg Oral Q6H PRN Lindon Romp A, NP      . alum & mag hydroxide-simeth (MAALOX/MYLANTA) 200-200-20 MG/5ML suspension 30 mL  30 mL Oral Q4H PRN Rozetta Nunnery, NP      . DULoxetine (CYMBALTA) DR capsule 60 mg  60 mg Oral Daily Izediuno, Vincent A, MD      . hydrOXYzine (ATARAX/VISTARIL) tablet 25 mg  25 mg Oral TID PRN Lindon Romp A, NP      . magnesium hydroxide (MILK OF MAGNESIA) suspension 30 mL  30 mL Oral Daily PRN Lindon Romp A, NP      . traZODone (DESYREL) tablet 50 mg  50 mg Oral QHS PRN Lindon Romp A, NP   50 mg at 06/16/17 2259   PTA Medications: No medications prior to admission.    Patient Stressors: Financial difficulties Other: Social anxiety  Patient Strengths: Ability for insight Active sense of humor Average or above average intelligence Capable of independent living General fund of knowledge Motivation for treatment/growth Supportive family/friends  Treatment Modalities: Medication Management, Group therapy, Case management,  1 to 1 session with clinician, Psychoeducation, Recreational therapy.   Physician Treatment Plan for Primary Diagnosis: Severe recurrent major depression without psychotic features ([place]) Long Term Goal(s): Improvement in symptoms so as ready for discharge Improvement in symptoms so as ready for discharge   Short Term Goals: Ability to identify changes in lifestyle to reduce recurrence of condition will improve Ability to verbalize feelings will  improve Ability to disclose and discuss suicidal ideas Ability to demonstrate self-control will improve Ability to identify and develop effective coping behaviors will improve Ability to maintain clinical measurements within normal limits will improve Compliance with prescribed medications will improve Ability to identify triggers associated with substance abuse/mental health issues will improve Ability to identify changes in lifestyle to reduce recurrence of condition will improve Ability to verbalize feelings will improve Ability to disclose and discuss suicidal ideas Ability to demonstrate self-control will improve Ability to identify and develop effective coping behaviors will improve Ability to maintain clinical measurements within normal limits will improve Compliance with prescribed medications will improve Ability to identify triggers associated with substance abuse/mental health issues will improve  Medication Management: Evaluate patient's response, side effects, and tolerance of medication regimen.  Therapeutic Interventions: 1 to 1 sessions, Unit Group sessions and Medication administration.  Evaluation of Outcomes: Progressing  Physician Treatment Plan for Secondary Diagnosis: Principal Problem:   Severe recurrent major depression without psychotic features ([place])  Long Term Goal(s): Improvement in symptoms so as ready for discharge Improvement in symptoms so as ready for discharge   Short Term Goals: Ability to identify changes in lifestyle to reduce recurrence of condition will improve Ability to verbalize feelings will improve Ability to disclose and discuss suicidal ideas Ability to demonstrate self-control will improve Ability to identify and develop effective coping behaviors will improve Ability to maintain clinical measurements within normal limits will improve Compliance with prescribed medications will improve Ability to identify triggers associated with  substance abuse/mental health issues will improve Ability to identify changes in lifestyle to reduce recurrence of  condition will improve Ability to verbalize feelings will improve Ability to disclose and discuss suicidal ideas Ability to demonstrate self-control will improve Ability to identify and develop effective coping behaviors will improve Ability to maintain clinical measurements within normal limits will improve Compliance with prescribed medications will improve Ability to identify triggers associated with substance abuse/mental health issues will improve     Medication Management: Evaluate patient's response, side effects, and tolerance of medication regimen.  Therapeutic Interventions: 1 to 1 sessions, Unit Group sessions and Medication administration.  Evaluation of Outcomes: Progressing   RN Treatment Plan for Primary Diagnosis: Severe recurrent major depression without psychotic features ([place]) Long Term Goal(s): Knowledge of disease and therapeutic regimen to maintain health will improve  Short Term Goals: Ability to remain free from injury will improve, Ability to demonstrate self-control and Ability to disclose and discuss suicidal ideas  Medication Management: RN will administer medications as ordered by provider, will assess and evaluate patient's response and provide education to patient for prescribed medication. RN will report any adverse and/or side effects to prescribing provider.  Therapeutic Interventions: 1 on 1 counseling sessions, Psychoeducation, Medication administration, Evaluate responses to treatment, Monitor vital signs and CBGs as ordered, Perform/monitor CIWA, COWS, AIMS and Fall Risk screenings as ordered, Perform wound care treatments as ordered.  Evaluation of Outcomes: Progressing   LCSW Treatment Plan for Primary Diagnosis: Severe recurrent major depression without psychotic features ([place]) Long Term Goal(s): Safe transition to appropriate next  level of care at discharge, Engage patient in therapeutic group addressing interpersonal concerns.  Short Term Goals: Engage patient in aftercare planning with referrals and resources, Increase emotional regulation and Increase skills for wellness and recovery  Therapeutic Interventions: Assess for all discharge needs, 1 to 1 time with Social worker, Explore available resources and support systems, Assess for adequacy in community support network, Educate family and significant other(s) on suicide prevention, Complete Psychosocial Assessment, Interpersonal group therapy.  Evaluation of Outcomes: Not Met   Progress in Treatment: Attending groups: No. New to unit. Continuing to assess.  Participating in groups: No. Taking medication as prescribed: Yes. Toleration medication: Yes. Family/Significant other contact made: No, will contact:  family member/mother if patient consents Patient understands diagnosis: Yes. Discussing patient identified problems/goals with staff: Yes. Medical problems stabilized or resolved: Yes. Denies suicidal/homicidal ideation: Yes. Issues/concerns per patient self-inventory: No. Other: n/a  New problem(s) identified: No, Describe:  n/a  New Short Term/Long Term Goal(s): medication management for mood stabilization; elimination of SI thoughts; development of comprehensive mental wellness plan.   Discharge Plan or Barriers: CSW assessing for appropriate referrals.   Reason for Continuation of Hospitalization: Anxiety Depression Medication stabilization Suicidal ideation  Estimated Length of Stay: Monday, 06/20/17  Attendees: Patient: 06/17/2017 12:46 PM  Physician: Dr. Nancy Fetter MD; Dr. Sanjuana Letters MD 06/17/2017 12:46 PM  Nursing: Kalman Jewels RN 06/17/2017 12:46 PM  RN Care Manager:x 06/17/2017 12:46 PM  Social Worker: Press photographer, LCSW 06/17/2017 12:46 PM  Recreational Therapist: x 06/17/2017 12:46 PM  Other: Lindell Spar NP; Darnelle Maffucci Money NP  06/17/2017 12:46 PM  Other:  06/17/2017 12:46 PM  Other: 06/17/2017 12:46 PM    Scribe for Treatment Team: [place], LCSW 06/17/2017 12:46 PM

## 2017-06-18 MED ORDER — MIRTAZAPINE 15 MG PO TABS
15.0000 mg | ORAL_TABLET | Freq: Every day | ORAL | Status: DC
  Administered 2017-06-18: 15 mg via ORAL
  Filled 2017-06-18 (×3): qty 1

## 2017-06-18 NOTE — Progress Notes (Signed)
Adult Psychoeducational Group Note  Date:  06/18/2017 Time:  10:39 PM  Group Topic/Focus:  Wrap-Up Group:   The focus of this group is to help patients review their daily goal of treatment and discuss progress on daily workbooks.  Participation Level:  Active  Participation Quality:  Appropriate  Affect:  Appropriate  Cognitive:  Appropriate  Insight: Appropriate  Engagement in Group:  Engaged  Modes of Intervention:  Discussion  Additional Comments:  Pt stated his goal for today was to talk with his family. Pt stated he achieved his goal and the conversation with his family help improve his day. Pt rated his over all day a 9. Pt stated he attend all groups held today.  Felipa FurnaceChristopher  Nhia Heaphy 06/18/2017, 10:39 PM

## 2017-06-18 NOTE — BHH Group Notes (Signed)
LCSW Group Therapy Note  Date/Time:  06/18/2017   10:00AM-11:00AM  Type of Therapy and Topic:  Group Therapy:  Fears and Unhealthy/Healthy Coping Skills  Participation Level:  Active   Description of Group:  The focus of this group was to discuss some of the prevalent fears that patients experience, and to identify the commonalities among group members.  A fun exercise was used to initiate the discussion, followed by writing on the white board a group-generated list of unhealthy coping and healthy coping techniques to deal with each fear.    Therapeutic Goals: 1. Patient will be able to distinguish between healthy and unhealthy coping skills 2. Patient will identify and describe 3 fears they experience 3. Patient will identify one positive coping strategy for each fear they experience 4. Patient will respond empathetically to peers' statements regarding fears they experience  Summary of Patient Progress:  The patient expressed himself thoroughly throughout group with insightful comments, but was so soft spoken that he always had to be asked to repeat himself.  He said he will likely be by himself on Christmas, and was able to articulate plans for self-care.  Therapeutic Modalities Cognitive Behavioral Therapy Motivational Interviewing  Ambrose MantleMareida Grossman-Orr, LCSW

## 2017-06-18 NOTE — Progress Notes (Signed)
[facility] MD Progress Note  06/18/2017 11:12 AM Jermaine Mayer  MRN:  [id number] Subjective:   21 y.o AAM, single, recently lost his job and relationship, lives alone. Background history of MDD recurrent. Presented to the unit in company of his family. His family had not been able to reach him in over two weeks. He has not been answering his phone. Mother visited but he would not answer the door. 911 was called and they found him naked in the shower crying. Reported to have been very depressed. Lost his job two months ago. Lost his relationship about the same time. Routine labs are within normal limits.  Toxicology is negative,  UDS for THC , BAL<10 mg/dl.  Chart reviewed today. Patient discussed at team today.  Staff reports that he has been appropriate. He has been coloring at the day room. He is grooming self. He is interacting with peers. He has not voiced any suicidal or homicidal thoughts. He has not been observed to be internally stimulated. He is eating his meals. He slept well last night.  Seen today.  Says he feels better. Sleep was broken last night. Multiple awakening has been an issue for him. Says he has been trying to stay up most of the day. He has been interacting with peers. Says he enjoys it. No suicidal thoughts. No evidence of mania or psychosis. No side effects from recent medication adjustment. We have agreed to augment with Mirtazapine.   Principal Problem: Severe recurrent major depression without psychotic features ([place]) Diagnosis:   Patient Active Problem List   Diagnosis Date Noted  . Severe recurrent major depression without psychotic features ([place]) [F33.2] 06/16/2017   Total Time spent with patient: 30 minutes  Past Psychiatric History: As in H&P  Past Medical History: History reviewed. No pertinent past medical history. History reviewed. No pertinent surgical history. Family History: History reviewed. No pertinent family history. Family Psychiatric  History: As in  H&P Social History:  Social History   Substance and Sexual Activity  Alcohol Use Yes   Comment: occasionally     Social History   Substance and Sexual Activity  Drug Use No    Social History   Socioeconomic History  . Marital status: Single    Spouse name: None  . Number of children: None  . Years of education: None  . Highest education level: None  Social Needs  . Financial resource strain: None  . Food insecurity - worry: None  . Food insecurity - inability: None  . Transportation needs - medical: None  . Transportation needs - non-medical: None  Occupational History  . None  Tobacco Use  . Smoking status: Never Smoker  . Smokeless tobacco: Never Used  Substance and Sexual Activity  . Alcohol use: Yes    Comment: occasionally  . Drug use: No  . Sexual activity: Not Currently  Other Topics Concern  . None  Social History Narrative  . None   Additional Social History:    Pain Medications: See MAR Prescriptions: See MAR Over the Counter: See MAR History of alcohol / drug use?: Yes Name of Substance 1: Cannabis 1 - Age of First Use: 16 1 - Amount (size/oz): Blunt 1 - Frequency: Every now and then 1 - Last Use / Amount: 6 weeks ago Name of Substance 2: ETOH 2 - Age of First Use: 20 2 - Amount (size/oz): varies 2 - Frequency: every now and then 2 - Last Use / Amount: 3 months ago Name of Substance  3: Amphetamines  3 - Age of First Use: 17 3 - Amount (size/oz): Ukn 3 - Frequency: every now and then 3 - Last Use / Amount: 18 months ago              Sleep: Good  Appetite:  Good  Current Medications: Current Facility-Administered Medications  Medication Dose Route Frequency Provider Last Rate Last Dose  . acetaminophen (TYLENOL) tablet 650 mg  650 mg Oral Q6H PRN Lindon Romp A, NP      . alum & mag hydroxide-simeth (MAALOX/MYLANTA) 200-200-20 MG/5ML suspension 30 mL  30 mL Oral Q4H PRN Rozetta Nunnery, NP      . DULoxetine (CYMBALTA) DR capsule  60 mg  60 mg Oral Daily Maahir Horst, Laruth Bouchard, MD   60 mg at 06/18/17 9379  . hydrOXYzine (ATARAX/VISTARIL) tablet 25 mg  25 mg Oral TID PRN Rozetta Nunnery, NP      . magnesium hydroxide (MILK OF MAGNESIA) suspension 30 mL  30 mL Oral Daily PRN Lindon Romp A, NP      . traZODone (DESYREL) tablet 50 mg  50 mg Oral QHS PRN Rozetta Nunnery, NP   50 mg at 06/16/17 2259    Lab Results:  Results for orders placed or performed during the hospital encounter of 06/16/17 (from the past 48 hour(s))  Urine rapid drug screen (hosp performed)not at [facility]     Status: Abnormal   Collection Time: 06/16/17  8:51 PM  Result Value Ref Range   Opiates NONE DETECTED NONE DETECTED   Cocaine NONE DETECTED NONE DETECTED   Benzodiazepines NONE DETECTED NONE DETECTED   Amphetamines NONE DETECTED NONE DETECTED   Tetrahydrocannabinol POSITIVE (A) NONE DETECTED   Barbiturates NONE DETECTED NONE DETECTED    Comment: (NOTE) DRUG SCREEN FOR MEDICAL PURPOSES ONLY.  IF CONFIRMATION IS NEEDED FOR ANY PURPOSE, NOTIFY LAB WITHIN 5 DAYS. LOWEST DETECTABLE LIMITS FOR URINE DRUG SCREEN Drug Class                     Cutoff (ng/mL) Amphetamine and metabolites    1000 Barbiturate and metabolites    200 Benzodiazepine                 024 Tricyclics and metabolites     300 Opiates and metabolites        300 Cocaine and metabolites        300 THC                            50 Performed at [facility], [place] [address], [place], [place] [address]   CBC     Status: Abnormal   Collection Time: 06/17/17  7:50 AM  Result Value Ref Range   WBC 10.8 (H) 4.0 - 10.5 K/uL   RBC 5.57 4.22 - 5.81 MIL/uL   Hemoglobin 16.2 13.0 - 17.0 g/dL   HCT 46.4 39.0 - 52.0 %   MCV 83.3 78.0 - 100.0 fL   MCH 29.1 26.0 - 34.0 pg   MCHC 34.9 30.0 - 36.0 g/dL   RDW 12.6 11.5 - 15.5 %   Platelets 267 150 - 400 K/uL    Comment: Performed at [facility], [place] [address] [place], [place] [address]  Comprehensive  metabolic panel     Status: Abnormal   Collection Time: 06/17/17  7:50 AM  Result Value Ref Range   Sodium 139 135 -  145 mmol/L   Potassium 3.5 3.5 - 5.1 mmol/L   Chloride 105 101 - 111 mmol/L   CO2 25 22 - 32 mmol/L   Glucose, Bld 119 (H) 65 - 99 mg/dL   BUN 11 6 - 20 mg/dL   Creatinine, Ser 0.92 0.61 - 1.24 mg/dL   Calcium 9.7 8.9 - 10.3 mg/dL   Total Protein 8.1 6.5 - 8.1 g/dL   Albumin 4.4 3.5 - 5.0 g/dL   AST 27 15 - 41 U/L   ALT 20 17 - 63 U/L   Alkaline Phosphatase 61 38 - 126 U/L   Total Bilirubin 0.8 0.3 - 1.2 mg/dL   GFR calc non Af Amer >60 >60 mL/min   GFR calc Af Amer >60 >60 mL/min    Comment: (NOTE) The eGFR has been calculated using the CKD EPI equation. This calculation has not been validated in all clinical situations. eGFR's persistently <60 mL/min signify possible Chronic Kidney Disease.    Anion gap 9 5 - 15    Comment: Performed at [facility], [place] [address], [place], [place] [address]  Hemoglobin A1c     Status: None   Collection Time: 06/17/17  7:50 AM  Result Value Ref Range   Hgb A1c MFr Bld 4.8 4.8 - 5.6 %    Comment: (NOTE) Pre diabetes:          5.7%-6.4% Diabetes:              >6.4% Glycemic control for   <7.0% adults with diabetes    Mean Plasma Glucose 91.06 mg/dL    Comment: Performed at [place] [address] [place], [place] [address]  Lipid panel     Status: Abnormal   Collection Time: 06/17/17  7:50 AM  Result Value Ref Range   Cholesterol 177 0 - 200 mg/dL   Triglycerides 144 <150 mg/dL   HDL 37 (L) >40 mg/dL   Total CHOL/HDL Ratio 4.8 RATIO   VLDL 29 0 - 40 mg/dL   LDL Cholesterol 111 (H) 0 - 99 mg/dL    Comment:        Total Cholesterol/HDL:CHD Risk Coronary Heart Disease Risk Table                     Men   Women  1/2 Average Risk   3.4   3.3  Average Risk       5.0   4.4  2 X Average Risk   9.6   7.1  3 X Average Risk  23.4   11.0        Use the calculated Patient Ratio above and the  CHD Risk Table to determine the patient's CHD Risk.        ATP III CLASSIFICATION (LDL):  <100     mg/dL   Optimal  100-129  mg/dL   Near or Above                    Optimal  130-159  mg/dL   Borderline  160-189  mg/dL   High  >190     mg/dL   Very High Performed at [place] [address], [place], [place] [address]   TSH     Status: None   Collection Time: 06/17/17  7:50 AM  Result Value Ref Range   TSH 1.367 0.350 - 4.500 uIU/mL    Comment: Performed by a 3rd Generation assay with a  functional sensitivity of <=0.01 uIU/mL. Performed at [facility], [place] [address] [place], [place] [address]     Blood Alcohol level:  No results found for: [facility]  Metabolic Disorder Labs: Lab Results  Component Value Date   HGBA1C 4.8 06/17/2017   MPG 91.06 06/17/2017   No results found for: PROLACTIN Lab Results  Component Value Date   CHOL 177 06/17/2017   TRIG 144 06/17/2017   HDL 37 (L) 06/17/2017   CHOLHDL 4.8 06/17/2017   VLDL 29 06/17/2017   LDLCALC 111 (H) 06/17/2017    Physical Findings: AIMS: Facial and Oral Movements Muscles of Facial Expression: None, normal Lips and Perioral Area: None, normal Jaw: None, normal Tongue: None, normal,Extremity Movements Upper (arms, wrists, hands, fingers): None, normal Lower (legs, knees, ankles, toes): None, normal, Trunk Movements Neck, shoulders, hips: None, normal, Overall Severity Severity of abnormal movements (highest score from questions above): None, normal Incapacitation due to abnormal movements: None, normal Patient's awareness of abnormal movements (rate only patient's report): No Awareness, Dental Status Current problems with teeth and/or dentures?: No Does patient usually wear dentures?: No  CIWA:    COWS:     Musculoskeletal: Strength & Muscle Tone: within normal limits Gait & Station: normal Patient leans: N/A  Psychiatric Specialty Exam: Physical Exam   Constitutional: He appears well-developed and well-nourished.  HENT:  Head: Normocephalic and atraumatic.  Respiratory: Effort normal.  Neurological: He is alert.  Psychiatric:  As above    ROS  Blood pressure (!) 150/84, pulse 79, temperature 98.1 F (36.7 C), temperature source Oral, resp. rate 16, height _0  (1.803 m), weight 98.9 kg (218 lb), SpO2 99 %.Body mass index is 30.4 kg/m.  General Appearance: Neatly dressed, calm and cooperative. Appropriate behavior.   Eye Contact:  Good  Speech:  Clear and Coherent and Normal Rate  Volume:  Normal  Mood:  Marginally better  Affect:  Appropriate and Restricted  Thought Process:  Linear  Orientation:  Full (Time, Place, and Person)  Thought Content:  No delusional theme. No preoccupation with violent thoughts. No negative ruminations. No obsession.  No hallucination in any modality.   Suicidal Thoughts:  No  Homicidal Thoughts:  No  Memory:  Immediate;   Good Recent;   Good Remote;   Good  Judgement:  Good  Insight:  Good  Psychomotor Activity:  Normal  Concentration:  Concentration: Good and Attention Span: Good  Recall:  Good  Fund of Knowledge:  Good  Language:  Good  Akathisia:  Negative  Handed:    AIMS (if indicated):     Assets:  Communication Skills Desire for Improvement Housing Physical Health Resilience  ADL's:  Intact  Cognition:  WNL  Sleep:  Number of Hours: 6.75     Treatment Plan Summary:  Patient is tolerating recent medication adjustment well. Reports multiple awakenings. We discussed use of Mirtazapine which will work synergetically with his SNRI. He consented to treatment after we reviewed the risks and benefits.     Psychiatric: MDD Recurrent THC use Disorder  Medical:  Psychosocial:  Unemployed Recent break up  PLAN: 1. Mirtazapine 15 mg at bedtime 2. Continue other medications at current dose 3. Continue to monitor mood, behavior and interaction with peers     Artist [place], MD 06/18/2017, 11:12 AM

## 2017-06-18 NOTE — Plan of Care (Signed)
Patient verbalizes understanding of information, education provided.  Patient has not engaged in self harm, denies thoughts to do so.   Patient is med compliant.

## 2017-06-18 NOTE — [facility] Group Notes (Signed)
[facility] Group Notes:  (Nursing/MHT/Case Management/Adjunct)  Date:  06/18/2017  Time:  1330  Type of Therapy:  Nurse Education  - Managing Negative Self Talk  Participation Level:  Active  Participation Quality:  Attentive  Affect:  Blunted  Cognitive:  Alert  Insight:  Improving  Engagement in Group:  Engaged  Modes of Intervention:  Discussion and Education  Summary of Progress/Problems: Patient educated on ways to "rewrite" negative self talk into positive statements.  Srihitha Tagliaferri Eakes 06/18/2017, 1415 

## 2017-06-18 NOTE — Progress Notes (Signed)
D: Patient observed coloring in dayroom. Cautious on approach, pleasant. Patient states he is doing "okay" today. Patient's affect flat, mood depressed. Per self inventory and discussions with writer, rates depression at a 3/10 and hopelessness at a 3/10.  Rates sleep as fair, appetite as good, energy as normal and concentration as good.  States goal for today is to "Idenitify and practice 3 centering methods, read, meditate." Denies pain, physical complaints.   A: Medicated per orders, no prns requested or requried. Level III obs in place for safety. Emotional support offered and self inventory reviewed. Encouraged completion of Suicide Safety Plan and programming participation.   R: Patient verbalizes understanding of POC. Patient denies SI/HI/AVH and remains safe on level III obs. Will continue to monitor closely and make verbal contact frequently.

## 2017-06-18 NOTE — Plan of Care (Signed)
  Coping: Ability to demonstrate self-control will improve 06/18/2017 2209 - Progressing by Delos HaringPhillips, Ange Puskas A, RN Note Pt has been calm an cooperative on the unit this evening   Activity: Interest or engagement in leisure activities will improve 06/18/2017 2209 - Progressing by Delos HaringPhillips, Montie Swiderski A, RN Note Pt seen in dayroom laughing and interacting with peers   Self-Concept: Level of anxiety will decrease 06/18/2017 2209 - Progressing by Delos HaringPhillips, Glennda Weatherholtz A, RN Note Pt stated he had a decreased level of anxiety today

## 2017-06-19 MED ORDER — MIRTAZAPINE 15 MG PO TABS: 15.0000 mg | ORAL_TABLET | Freq: Every day | ORAL | 0 refills | Status: AC

## 2017-06-19 MED ORDER — DULOXETINE HCL 60 MG PO CPEP: 60.0000 mg | ORAL_CAPSULE | Freq: Every day | ORAL | 0 refills | Status: AC

## 2017-06-19 MED ORDER — HYDROXYZINE HCL 25 MG PO TABS: 25.0000 mg | ORAL_TABLET | Freq: Three times a day (TID) | ORAL | 0 refills | Status: AC | PRN

## 2017-06-19 MED ORDER — TRAZODONE HCL 50 MG PO TABS: 50.0000 mg | ORAL_TABLET | Freq: Every evening | ORAL | 0 refills | Status: AC | PRN

## 2017-06-19 NOTE — [facility] Group Notes (Signed)
[facility] Therapy Note  Date/Time:  06/19/2017 10:00-11:00AM  Type of Therapy and Topic:  Group Therapy:  Healthy and Unhealthy Supports  Participation Level:  Active   Description of Group:  Patients in this group were introduced to the idea of adding a variety of healthy supports to address the various needs in their lives. The picture on the front of Sunday's workbook was used to demonstrate why more supports are needed in every patient's life.  Patients identified and described healthy supports versus unhealthy supports in general, then gave examples of each in their own lives.   They discussed what additional healthy supports could be helpful in their recovery and wellness after discharge in order to prevent future hospitalizations.   An emphasis was placed on using counselor, doctor, therapy groups, 12-step groups, and problem-specific support groups to expand supports.  They also worked as a group on developing a specific plan for several patients to deal with unhealthy supports through boundary-setting, psychoeducation with loved ones, and even termination of relationships.   Therapeutic Goals:   1)  discuss importance of adding supports to stay well once out of the hospital  2)  compare healthy versus unhealthy supports and identify some examples of each  3)  generate ideas and descriptions of healthy supports that can be added  4)  offer mutual support about how to address unhealthy supports  5)  encourage active participation in and adherence to discharge plan    Summary of Patient Progress:  The patient shared that the current healthy supports available in his life are her mother, to some degree his father, and his "nana", while the current unhealthy supports are his friends who feed into his depressive symptoms and tell him it's okay to not do anything, and also his "Mimwa" who is her mother's mother, with whom they have significantly toxic relationships.  The patient  expressed a willingness to add going to a gym to help in his recovery journey.   Therapeutic Modalities:   Motivational Interviewing Brief Solution-Focused Therapy  Jermaine MantleMareida Grossman-Orr, LCSW

## 2017-06-19 NOTE — Progress Notes (Signed)
Discharge Note:  Patient discharged home with family member.  Denied SI and HI.  Denied A/V hallucinations.  Suicide prevention information given and discussed with patient who stated he understood and had no questions.  Patient stated he received all his belongings, clothing, toiletries, misc items, prescriptions, etc.  Patient stated he appreciated all [facility].  All required discharge information given to patient at discharge.

## 2017-06-19 NOTE — [facility] Suicide Risk Assessment (Signed)
[facility] INPATIENT:  Family/Significant Other Suicide Prevention Education  Suicide Prevention Education:  Education Completed; mother, Jermaine Mayer [phone] has been identified by the patient as the family member/significant other with whom the patient will be residing, and identified as the person(s) who will aid the patient in the event of a mental health crisis (suicidal ideations/suicide attempt).  With written consent from the patient, the family member/significant other has been provided the following suicide prevention education, prior to the and/or following the discharge of the patient.  No weapons in the home.  The suicide prevention education provided includes the following:  Suicide risk factors  Suicide prevention and interventions  National Suicide Hotline telephone number  [facility] assessment telephone number  [facility] Emergency Assistance 911  [facility] and/or Residential Mobile Crisis Unit telephone number  Request made of family/significant other to:  Remove weapons (e.g., guns, rifles, knives), all items previously/currently identified as safety concern.    Remove drugs/medications (over-the-counter, prescriptions, illicit drugs), all items previously/currently identified as a safety concern.  The family member/significant other verbalizes understanding of the suicide prevention education information provided.  The family member/significant other agrees to remove the items of safety concern listed above.  Jermaine Mayer 06/19/2017, 9:14 AM

## 2017-06-19 NOTE — [facility] Counselor (Signed)
Clinical Social Work Note  While going over Suicide Prevention Education with patient's mother, Jermaine Mayer [phone], she stated she has spoken with him daily by phone and saw him in person Friday, feels he is doing much better, feels he is ready for discharge.  All family members will be out of town for 1 week over Christmas holiday until 06/30/16, and she is very concerned about him being alone.  She is asking if he can be discharged today so that family members can take him to the airport tomorrow [place]  If he was discharged very early tomorrow morning, this could still work.  Otherwise, she was informed the hospital could not keep him until 06/30/16.  Ambrose MantleMareida Grossman-Orr, LCSW 06/19/2017, 9:15 AM

## 2017-06-19 NOTE — Progress Notes (Signed)
  [facility] Discharge [facility] :  Will you be returning to the same living situation after discharge:  Yes,  [place] his [place] holidays At discharge, do you have transportation home?: Yes,  family members Do you have the ability to pay for your medications: Yes,  no barriers identified  Release of information consent forms completed and turned in to Medical Records by CSW.  Patient to Follow up at: Follow-up Information    [facility] Follow up on 06/27/2017.   Why:  Assessment with therapist on Monday, 12/31 at 8:30AM with Pincus Sanesarolyn Rifkin. Please call the office at discharge to pay $20 deposit and they would like a copy of insurance card, which you can text to them directly. Please ask about this when you call.  Contact information: [address] [place] [address] [phone]        [facility] Follow up on 07/13/2017.   Why:  Medication management appt with [facility]ilene [facility]az[facility]iff [facility] (soonest available) on Wed, 1/16 at 3:00PM. Thank you.  Contact information: [address] [place] [address] [phone]           Next level of care provider has access to [facility] Link:no  Safety Planning and Suicide Prevention discussed: Yes,  with mother  Have you used any form of tobacco in the last 30 days? (Cigarettes, Smokeless Tobacco, Cigars, and/or Pipes): No  Has patient been referred to the Quitline?: N/A patient is not a smoker  Patient has been referred for addiction treatment: N/A  Lynnell ChadMareida J Grossman-Orr, LCSW 06/19/2017, 3:36 PM

## 2017-06-19 NOTE — Discharge Summary (Signed)
Physician Discharge Summary Note  Patient:  Jermaine Mayer is an 21 y.o., male MRN:  [id number] DOB:  Oct 19, 1995 Patient phone:  [phone] (home)  Patient address:   [address] [place] [address],  Total Time spent with patient: 45 minutes  Date of Admission:  06/16/2017 Date of Discharge: 06/18/2017  Reason for Admission:   21 y.o AAM, single, recently lost his job and relationship, lives alone. Background history of MDD recurrent. Presented to the unit in company of his family. His family had not been able to reach him in over two weeks. He has not been answering his phone. Mother visited but he would not answer the door. 911 was called and they found him naked in the shower crying. Reported to have been very depressed. Lost his job two months ago. Lost his relationship about the same time. Routine labs are within normal limits.  Toxicology is negative,  UDS for THC , BAL<10 mg/dl.  At interview, patient reports a family and personal history of depression. Says he had been doing well until he lost his job two months ago. He had been at the job for nine months. 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 Says he was having problems with his girlfriend a couple of months ago. It affected him and he missed work twice. As per the policy at his work place he was fired. Patient says he has become withdrawn since then. He has been isolating himself in the house. He has been crying a lot. Says his sleep pattern had become erratic. He lost interest in everything. He was struggling to take care of his dog. Says his thought process slowed down. He was not able to enjoy his routine activities. Says he had not gone into his Facebook or Instagram in a long time. Patient detached self from friends. Says he signed into Reddit which hides his identity. Patient decreased appetite. He was drinking protein shakes. His weight fluctuated recently. Say he has lost a lot of self esteem. Patient denies any  suicidal thoughts. Says he has been hopeful he would find another job. His therapist started him on Duloxetine 30 mg daily. Says it has been marginally helpful.. No associated evidence of psychosis. No associated evidence of mania. No thoughts of violence. He minimizes use of THC. No alcohol use. Denies use of any other substance. No synthetic substance use. No access to weapons. No legal issues other than speeding tickets. No other stressors.      Principal Problem: Severe recurrent major depression without psychotic features [facility](HCC) Discharge Diagnoses: Patient Active Problem List   Diagnosis Date Noted  . Severe recurrent major depression without psychotic features (HCC) [F33.2] 06/16/2017    Past Psychiatric History: see H&P  Past Medical History: History reviewed. No pertinent past medical history. History reviewed. No pertinent surgical history. Family History: History reviewed. No pertinent family history. Family Psychiatric  History: see H&P Social History:  Social History   Substance and Sexual Activity  Alcohol Use Yes   Comment: occasionally     Social History   Substance and Sexual Activity  Drug Use No    Social History   Socioeconomic History  . Marital status: Single    Spouse name: None  . Number of children: None  . Years of education: None  . Highest education level: None  Social Needs  . Financial resource strain: None  . Food insecurity - worry: None  . Food insecurity - inability: None  . Transportation  needs - medical: None  . Transportation needs - non-medical: None  Occupational History  . None  Tobacco Use  . Smoking status: Never Smoker  . Smokeless tobacco: Never Used  Substance and Sexual Activity  . Alcohol use: Yes    Comment: occasionally  . Drug use: No  . Sexual activity: Not Currently  Other Topics Concern  . None  Social History Narrative  . None    Hospital Course:   Jermaine Doomndrew Saetern was admitted for Severe recurrent major  depression without psychotic features (HCC) , and crisis management.  Pt was treated discharged with the medications listed below under Medication List.  Medical problems were identified and treated as needed.  Home medications were restarted as appropriate.  Improvement was monitored by observation and Jermaine DoomAndrew Abe 's daily report of symptom reduction.  Emotional and mental status was monitored by daily self-inventory reports completed by Jermaine DoomAndrew Osei and clinical staff.         Jermaine Doomndrew Pavlov was evaluated by the treatment team for stability and plans for continued recovery upon discharge. Jermaine Doomndrew Lassalle 's motivation was an integral factor for scheduling further treatment. Employment, transportation, bed availability, health status, family support, and any pending legal issues were also considered during hospital stay. Pt was offered further treatment options upon discharge including but not limited to Residential, Intensive Outpatient, and Outpatient treatment.  Jermaine Doomndrew Mell will follow up with the services as listed below under Follow Up Information.     Upon completion of this admission the patient was both mentally and medically stable for discharge denying suicidal/homicidal ideation, auditory/visual/tactile hallucinations, delusional thoughts and paranoia.     Jermaine DoomAndrew Priola responded well to treatment with trazodone, remeron, cymbalta, vistaril without adverse effects. Pt demonstrated improvement without reported or observed adverse effects to the point of stability appropriate for outpatient management. Pertinent labs include: UDS+ for THC. Reviewed CBC, CMP, BAL, and UDS; all unremarkable aside from noted exceptions.    Physical Findings: AIMS: Facial and Oral Movements Muscles of Facial Expression: None, normal Lips and Perioral Area: None, normal Jaw: None, normal Tongue: None, normal,Extremity Movements Upper (arms, wrists, hands, fingers): None, normal Lower (legs, knees, ankles, toes):  None, normal, Trunk Movements Neck, shoulders, hips: None, normal, Overall Severity Severity of abnormal movements (highest score from questions above): None, normal Incapacitation due to abnormal movements: None, normal Patient's awareness of abnormal movements (rate only patient's report): No Awareness, Dental Status Current problems with teeth and/or dentures?: No Does patient usually wear dentures?: No  CIWA:    COWS:     Musculoskeletal: Strength & Muscle Tone: within normal limits Gait & Station: normal Patient leans: N/A  Psychiatric Specialty Exam: Physical Exam  Review of Systems  Psychiatric/Behavioral: Positive for depression. Negative for hallucinations and suicidal ideas. The patient is not nervous/anxious and does not have insomnia.   All other systems reviewed and are negative.   Blood pressure (!) 144/74, pulse 91, temperature 98.3 F (36.8 C), temperature source Oral, resp. rate 18, height 5\' 11"  (1.803 m), weight 98.9 kg (218 lb), SpO2 99 %.Body mass index is 30.4 kg/m.  SEE MD PSE WITHIN SRA    Have you used any form of tobacco in the last 30 days? (Cigarettes, Smokeless Tobacco, Cigars, and/or Pipes): No  Has this patient used any form of tobacco in the last 30 days? (Cigarettes, Smokeless Tobacco, Cigars, and/or Pipes) NO  Blood Alcohol level:  No results found for: [facility]  Metabolic Disorder Labs:  Lab Results  Component Value  Date   HGBA1C 4.8 06/17/2017   MPG 91.06 06/17/2017   No results found for: PROLACTIN Lab Results  Component Value Date   CHOL 177 06/17/2017   TRIG 144 06/17/2017   HDL 37 (L) 06/17/2017   CHOLHDL 4.8 06/17/2017   VLDL 29 06/17/2017   LDLCALC 111 (H) 06/17/2017    See Psychiatric Specialty Exam and Suicide Risk Assessment completed by Attending Physician prior to discharge.  Discharge destination:  Home  Is patient on multiple antipsychotic therapies at discharge:  No   Has Patient had three or more failed trials of  antipsychotic monotherapy by history:  No  Recommended Plan for Multiple Antipsychotic Therapies: NA   Allergies as of 06/19/2017   No Known Allergies     Medication List    TAKE these medications     Indication  DULoxetine 60 MG capsule Commonly known as:  CYMBALTA Take 1 capsule (60 mg total) by mouth daily. Start taking on:  06/20/2017 What changed:    medication strength  how much to take  Indication:  Major Depressive Disorder   hydrOXYzine 25 MG tablet Commonly known as:  ATARAX/VISTARIL Take 1 tablet (25 mg total) by mouth 3 (three) times daily as needed for anxiety.  Indication:  Feeling Anxious   mirtazapine 15 MG tablet Commonly known as:  REMERON Take 1 tablet (15 mg total) by mouth at bedtime.  Indication:  Major Depressive Disorder, and insomnia   traZODone 50 MG tablet Commonly known as:  DESYREL Take 1 tablet (50 mg total) by mouth at bedtime as needed for sleep.  Indication:  Trouble Sleeping      Follow-up Information    [facility] Follow up on 06/27/2017.   Why:  Assessment with therapist on Monday, 12/31 at 8:30AM with Pincus Sanes. Please call the office at discharge to pay $20 deposit and they would like a copy of insurance card, which you can text to them directly. Please ask about this when you call.  Contact information: [address] [place] [address] [phone]        [facility] Follow up on 07/13/2017.   Why:  Medication management appt with Eilene Ghazi in the [facility] location (soonest available) on Wed, 1/16 at 3:00PM. Thank you.  Contact information: [address] [place] [address] [phone]           Follow-up recommendations:  Activity:  As tolerated Diet:  Heart healthy with low sodium.  Comments:  Take all medications as prescribed. Keep all follow-up appointments as scheduled.  Do not consume alcohol or use illegal drugs while on prescription  medications. Report any adverse effects from your medications to your primary care provider promptly.  In the event of recurrent symptoms or worsening symptoms, call 911, a crisis hotline, or go to the nearest emergency department for evaluation.    Signed: Beau Fanny, FNP 06/19/2017, 11:28 AM

## 2017-06-19 NOTE — [facility] Suicide Risk Assessment (Signed)
[facility]   Principal Problem: Severe recurrent major depression without psychotic features [facility](HCC) Discharge Diagnoses:  Patient Active Problem List   Diagnosis Date Noted  . Severe recurrent major depression without psychotic features (HCC) [F33.2] 06/16/2017    Total Time spent with patient: 30 minutes  Musculoskeletal: Strength & Muscle Tone: within normal limits Gait & Station: normal Patient leans: N/A  Psychiatric Specialty Exam: Review of Systems  Constitutional: Negative.   HENT: Negative.   Eyes: Negative.   Respiratory: Negative.   Cardiovascular: Negative.   Gastrointestinal: Negative.   Genitourinary: Negative.   Musculoskeletal: Negative.   Skin: Negative.   Neurological: Negative.   Endo/Heme/Allergies: Negative.   Psychiatric/Behavioral: Negative for depression, hallucinations, memory loss, substance abuse and suicidal ideas. The patient is not nervous/anxious and does not have insomnia.     Blood pressure (!) 144/74, pulse 91, temperature 98.3 F (36.8 C), temperature source Oral, resp. rate 18, height 5\' 11"  (1.803 m), weight 98.9 kg (218 lb), SpO2 99 %.Body mass index is 30.4 kg/m.  General Appearance: Neatly dressed, pleasant, engaging well and cooperative. Appropriate behavior. Not in any distress. Good relatedness. Not internally stimulated'  Eye Contact::  Good  Speech:  Spontaneous, normal prosody. Normal tone and rate.   Volume:  Normal  Mood:  Euthymic  Affect:  Appropriate and Full Range  Thought Process:  Linear  Orientation:  Full (Time, Place, and Person)  Thought Content:  No delusional theme. No preoccupation with violent thoughts. No negative ruminations. No obsession.  No hallucination in any modality.   Suicidal Thoughts:  No  Homicidal Thoughts:  No  Memory:  Immediate;   Good Recent;   Good Remote;   Good  Judgement:  Good  Insight:  Good  Psychomotor Activity:  Normal  Concentration:  Good  Recall:   Good  Fund of Knowledge:Good  Language: Good  Akathisia:  Negative  Handed:    AIMS (if indicated):     Assets:  Communication Skills Desire for Improvement Housing Physical Health Resilience  Sleep:  Number of Hours: 5.25  Cognition: WNL  ADL's:  Intact   Clinical  Assessment::   21 y.o AAM, single, recently lost his job and relationship, lives alone. Background history of MDD recurrent. Presented to the unit in company of his family. His family had not been able to reach him in over two weeks. He has not been answering his phone. Mother visited but he would not answer the door. 911 was called and they found him naked in the shower crying. Reported to have been very depressed. Lost his job two months ago. Lost his relationship about the same time. Routine labs are within normal limits.  Toxicology is negative,  UDS for THC , BAL<10 mg/dl.  Seen today. Says he slept well last night. He is tolerating his medications well. Reports depression is lifting. His processing speed is improving. He is no longer dwelling on negative thoughts. He is eating well. His energy levels are back to normal. He is pleased with family's plans for a trip. He is looking forward to see his grand parents soon.  He is able to think clearly. He is able to focus on task. His thoughts are not crowded or racing. No evidence of mania. No hallucination in any modality. He is not making any delusional statement. No passivity of will/thought. He is fully in touch with reality. No thoughts of suicide. No thoughts of homicide. No violent thoughts. No overwhelming anxiety. No access  to weapons.  Nursing staff reports that patient has been appropriate on the unit. Patient has been interacting well with peers. No behavioral issues. Patient has not voiced any suicidal thoughts. Patient has not been observed to be internally stimulated. Patient has been adherent with treatment recommendations. Patient has been tolerating their medication  well.   Patient was discussed at team. Team members feels that patient is back to his baseline level of function. Team agrees with plan to discharge patient today.   Demographic Factors:  NA  Loss Factors: Decrease in vocational status and Loss of significant relationship  Historical Factors: NA  Risk Reduction Factors:   Sense of responsibility to family, Religious beliefs about death, Positive social support, Positive therapeutic relationship and Positive coping skills or problem solving skills  Continued Clinical Symptoms:  As above   Cognitive Features That Contribute To Risk:  None    Suicide Risk:  Minimal: No identifiable suicidal ideation.  Patients presenting with no risk factors but with morbid ruminations; may be classified as minimal risk based on the severity of the depressive symptoms  Follow-up Information    [facility] Follow up on 06/27/2017.   Why:  Assessment with therapist on Monday, 12/31 at 8:30AM with Pincus Sanesarolyn Rifkin. Please call the office at discharge to pay $20 deposit and they would like a copy of insurance card, which you can text to them directly. Please ask about this when you call.  Contact information: [address] [place] [address] [phone]        [facility] Follow up on 07/13/2017.   Why:  Medication management appt with Eilene [facility]aziliff [facility] (soonest available) on Wed, 1/16 at 3:00PM. Thank you.  Contact information: [address] [place] [address] [phone]           Plan Of Care/Follow-up recommendations:  1. Continue current psychotropic medications 2. Mental health and addiction follow up as arranged.  3. Discharge in care of his family 4. Provided limited quantity of prescriptions    Georgiann CockerVincent A Izediuno, MD 06/19/2017, 10:44 AM

## 2017-06-19 NOTE — Progress Notes (Signed)
D:  Patient denied SI and HI, contracts for safety.  Denied A/V hallucinations. A:  Medications administered per MD Orders.  Emotional support and encouragement given patient. R: Safety maintained with 15 minute checks.
